# Patient Record
Sex: Male | Born: 1980 | Race: Black or African American | Hispanic: No | Marital: Single | Smoking: Current some day smoker
Health system: Southern US, Community
[De-identification: ages and names within clinical notes are randomized; demographics above are authoritative.]

## PROBLEM LIST (undated history)

## (undated) HISTORY — PX: HERNIA REPAIR: SHX51

---
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
## 2002-04-02 ENCOUNTER — Emergency Department (HOSPITAL_COMMUNITY): Admission: EM | Admit: 2002-04-02 | Discharge: 2002-04-02 | Payer: Self-pay

## 2002-05-03 ENCOUNTER — Emergency Department (HOSPITAL_COMMUNITY): Admission: EM | Admit: 2002-05-03 | Discharge: 2002-05-04 | Payer: Self-pay | Admitting: Nurse Practitioner

## 2002-06-16 ENCOUNTER — Encounter: Admission: RE | Admit: 2002-06-16 | Discharge: 2002-06-16 | Payer: Self-pay | Admitting: Family Medicine

## 2002-06-16 ENCOUNTER — Encounter: Payer: Self-pay | Admitting: Family Medicine

## 2004-07-11 ENCOUNTER — Emergency Department (HOSPITAL_COMMUNITY): Admission: EM | Admit: 2004-07-11 | Discharge: 2004-07-12 | Payer: Self-pay | Admitting: Emergency Medicine

## 2004-11-09 ENCOUNTER — Emergency Department (HOSPITAL_COMMUNITY): Admission: EM | Admit: 2004-11-09 | Discharge: 2004-11-09 | Payer: Self-pay | Admitting: Family Medicine

## 2005-06-06 ENCOUNTER — Emergency Department (HOSPITAL_COMMUNITY): Admission: EM | Admit: 2005-06-06 | Discharge: 2005-06-06 | Payer: Self-pay | Admitting: Emergency Medicine

## 2005-07-13 ENCOUNTER — Encounter (INDEPENDENT_AMBULATORY_CARE_PROVIDER_SITE_OTHER): Payer: Self-pay | Admitting: Specialist

## 2005-07-13 ENCOUNTER — Ambulatory Visit (HOSPITAL_COMMUNITY): Admission: RE | Admit: 2005-07-13 | Discharge: 2005-07-13 | Payer: Self-pay | Admitting: General Surgery

## 2006-11-02 ENCOUNTER — Emergency Department (HOSPITAL_COMMUNITY): Admission: EM | Admit: 2006-11-02 | Discharge: 2006-11-02 | Payer: Self-pay | Admitting: Emergency Medicine

## 2008-01-15 ENCOUNTER — Encounter (INDEPENDENT_AMBULATORY_CARE_PROVIDER_SITE_OTHER): Payer: Self-pay | Admitting: Emergency Medicine

## 2008-01-15 ENCOUNTER — Emergency Department (HOSPITAL_COMMUNITY): Admission: EM | Admit: 2008-01-15 | Discharge: 2008-01-15 | Payer: Self-pay | Admitting: Emergency Medicine

## 2008-01-15 ENCOUNTER — Ambulatory Visit: Payer: Self-pay | Admitting: Vascular Surgery

## 2008-03-31 IMAGING — CR DG ANKLE COMPLETE 3+V*L*
2 series · 2 of 2 positions shown · non-contrast
Comparison: none

CLINICAL DATA: Ankle injury with lateral pain and swelling. 
 LEFT ANKLE - 3 VIEW:

[view not recorded (1 of 2)]
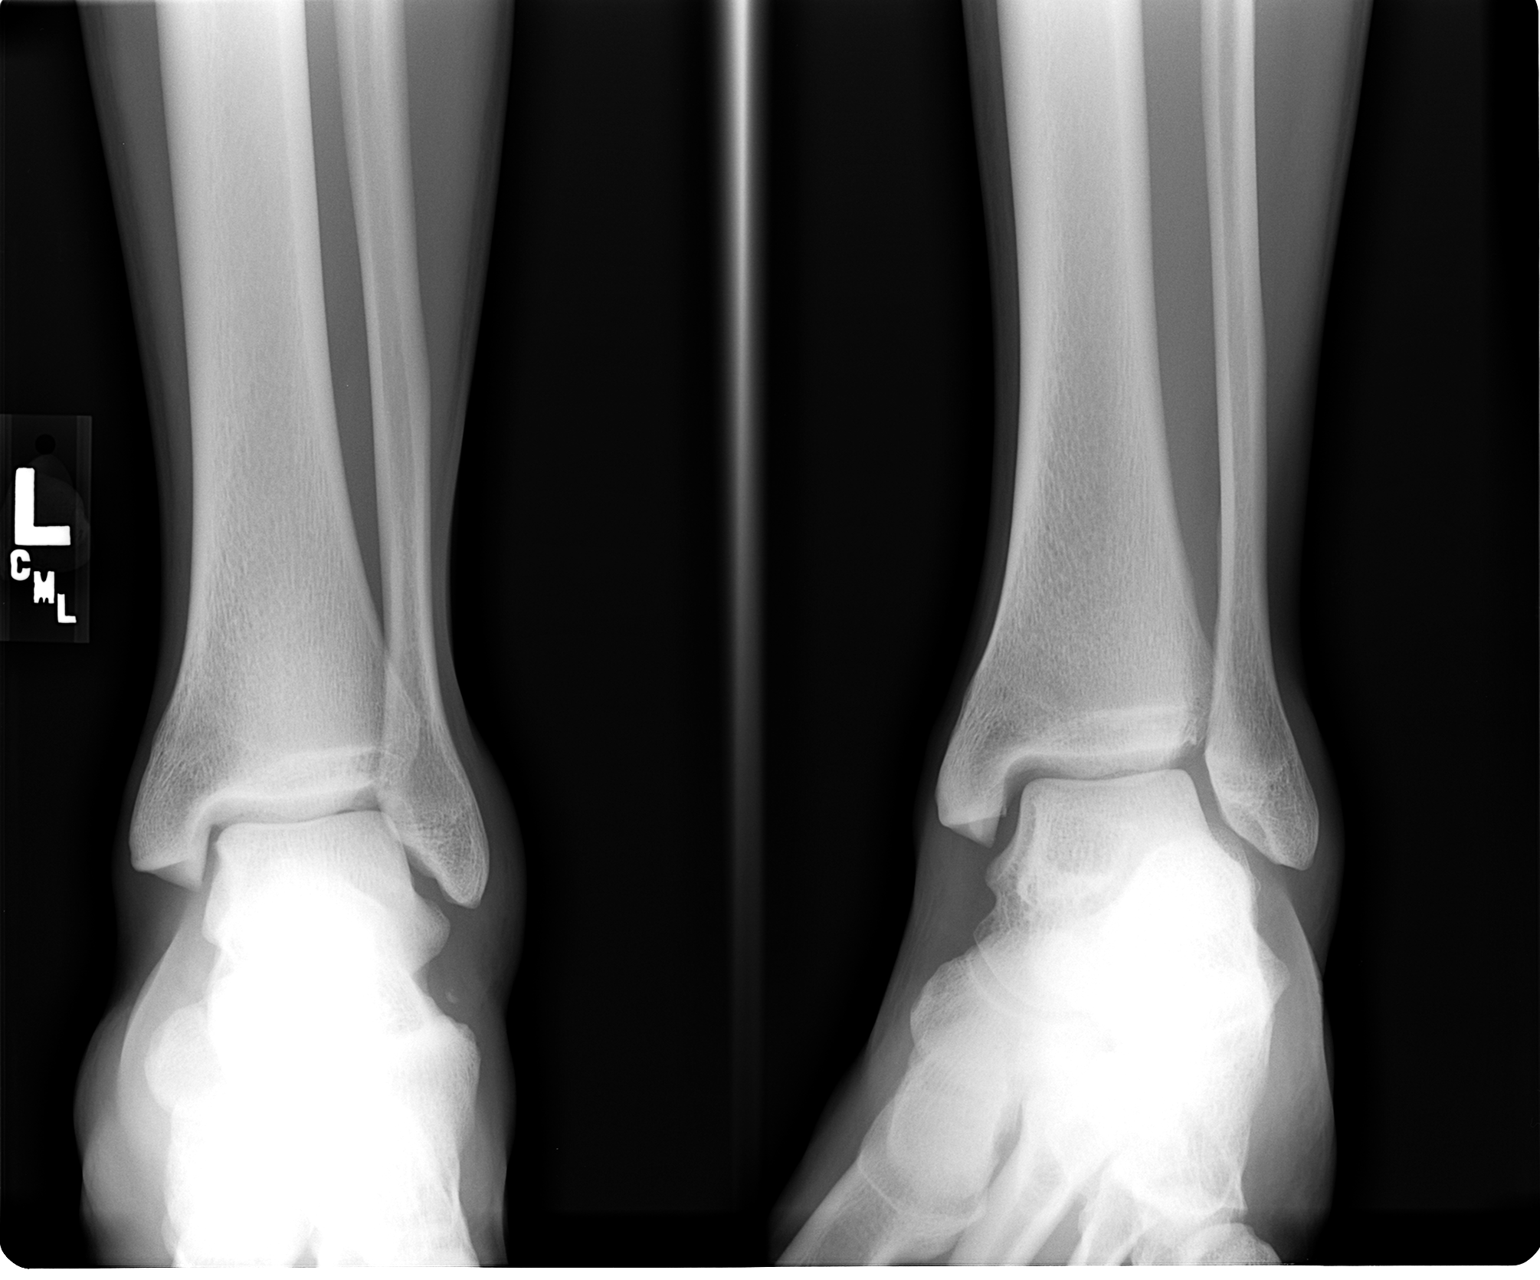

[view not recorded (2 of 2)]
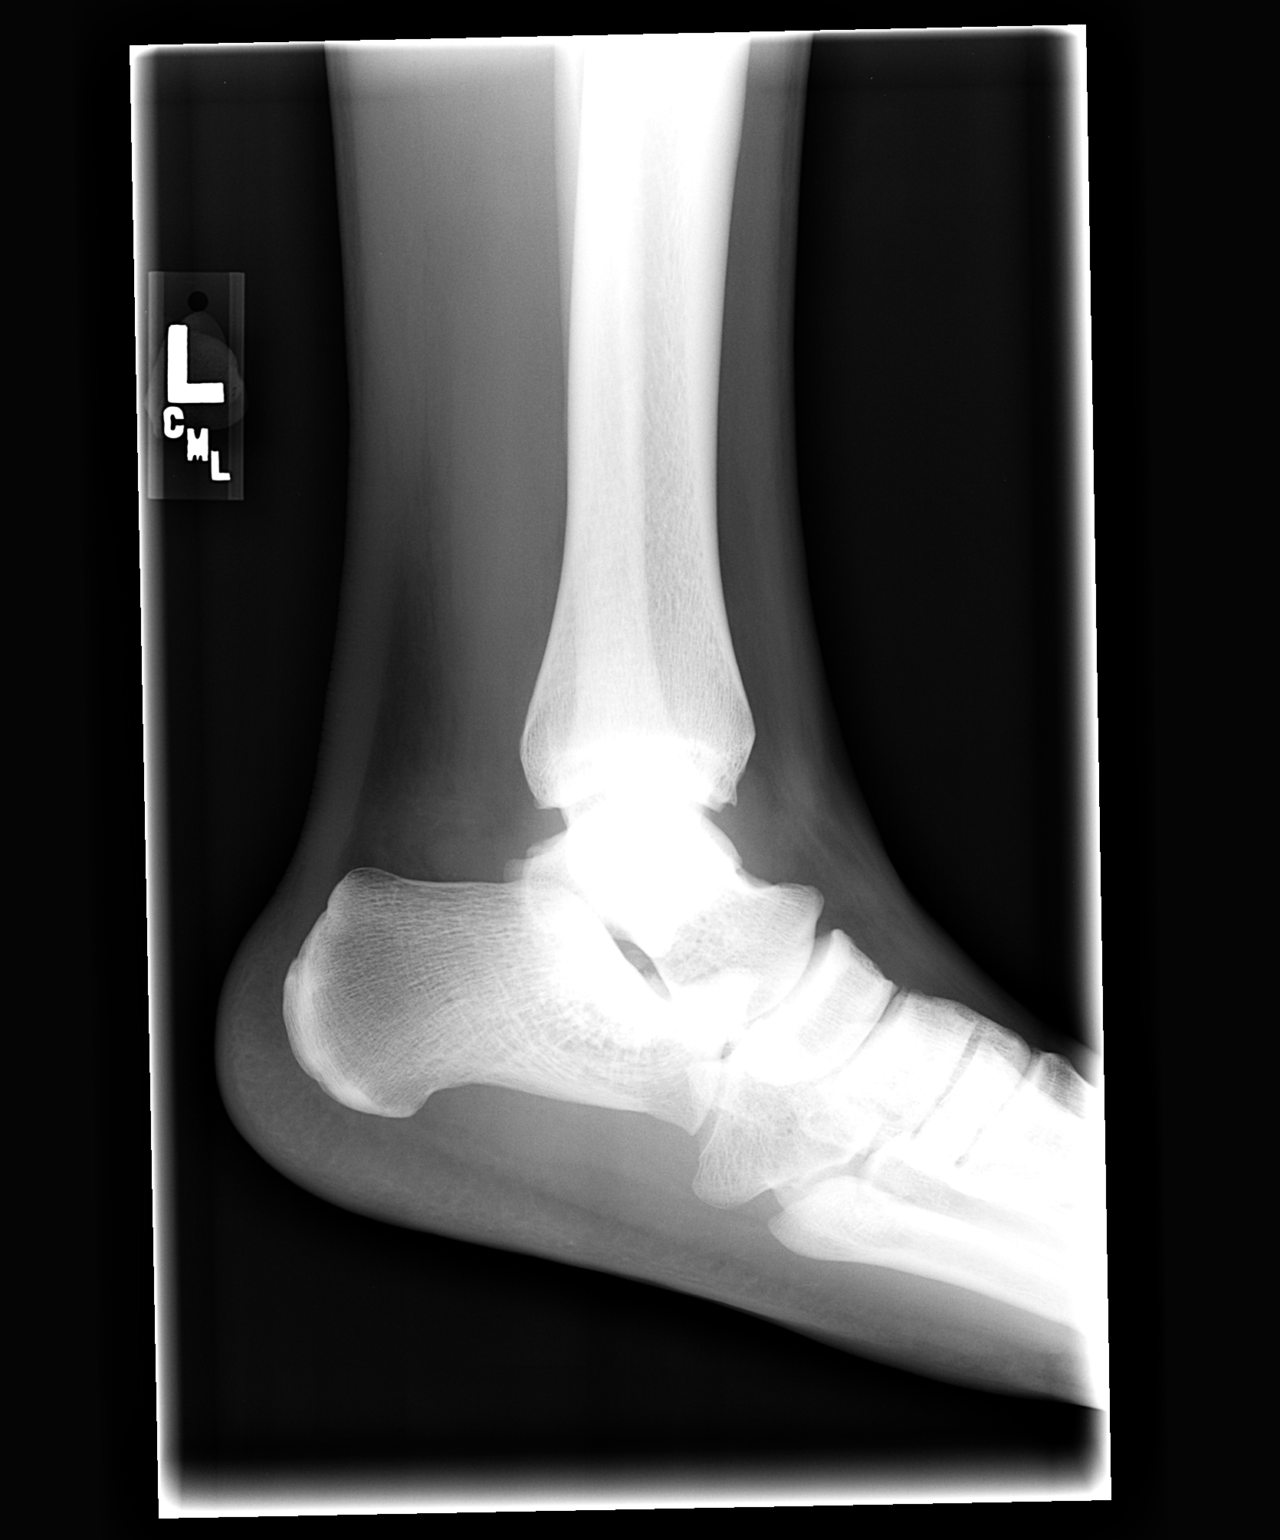

[2 of 2 positions shown; findings below may reference images not displayed]

FINDINGS: There is lateral soft tissue swelling and a joint effusion. There is a small bony density laterally that could relate to a calcaneal avulsion from a calcaneofibular ligament injury.
IMPRESSION: As discussed above.

## 2008-06-01 ENCOUNTER — Encounter: Admission: RE | Admit: 2008-06-01 | Discharge: 2008-06-01 | Payer: Self-pay | Admitting: Sports Medicine

## 2008-09-10 HISTORY — PX: KNEE SURGERY: SHX244

## 2009-04-11 ENCOUNTER — Emergency Department (HOSPITAL_COMMUNITY): Admission: EM | Admit: 2009-04-11 | Discharge: 2009-04-11 | Payer: Self-pay | Admitting: Emergency Medicine

## 2009-09-10 HISTORY — PX: KNEE SURGERY: SHX244

## 2009-10-29 IMAGING — US US ASPIRATION
1 series · 13 of 16 positions shown · non-contrast
Comparison: none

CLINICAL HISTORY: Right knee Baker's cyst.

[Series 1: us aspiration · 0.08mm/px · 29 acquisitions, 13 frames shown]
[im 1/29]
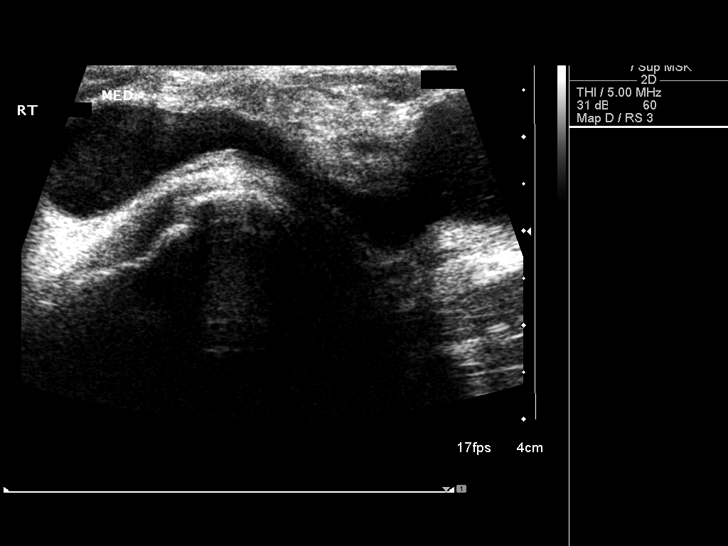
[im 2/29]
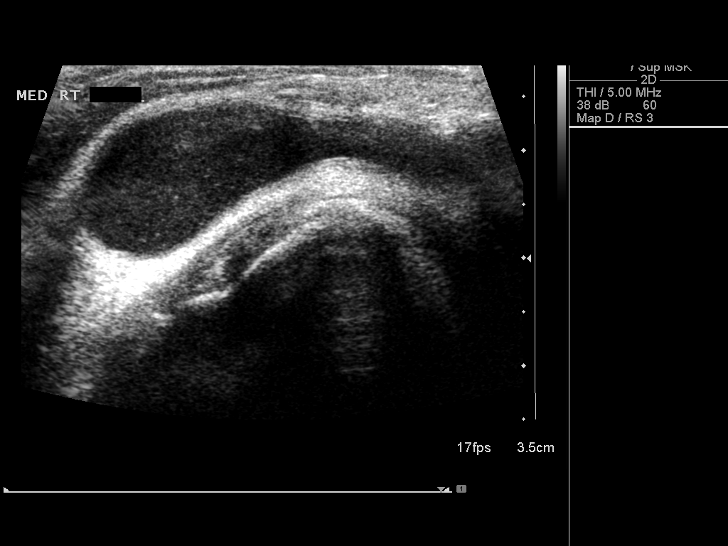
[im 6/29]
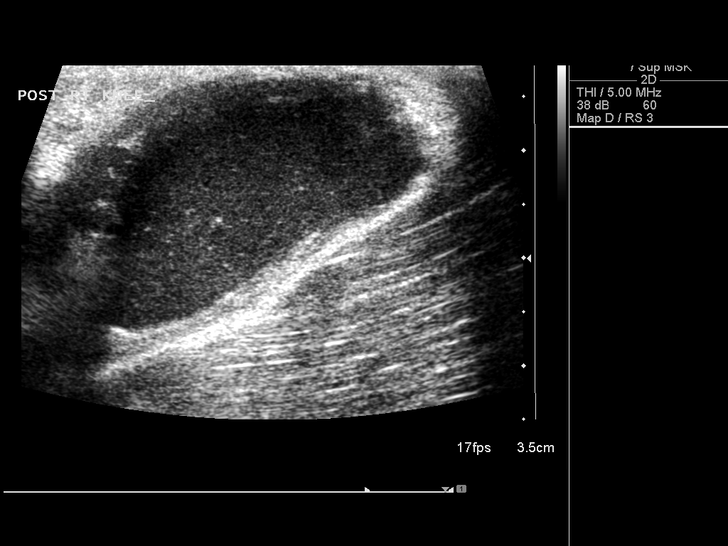
[im 8/29]
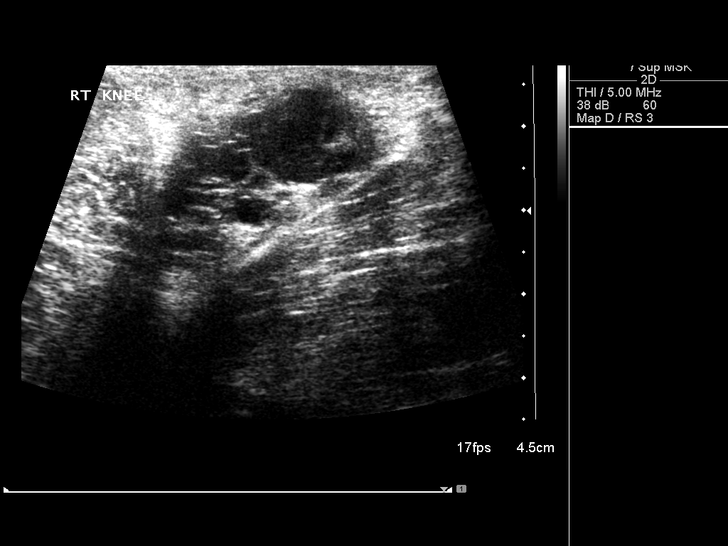
[im 10/29]
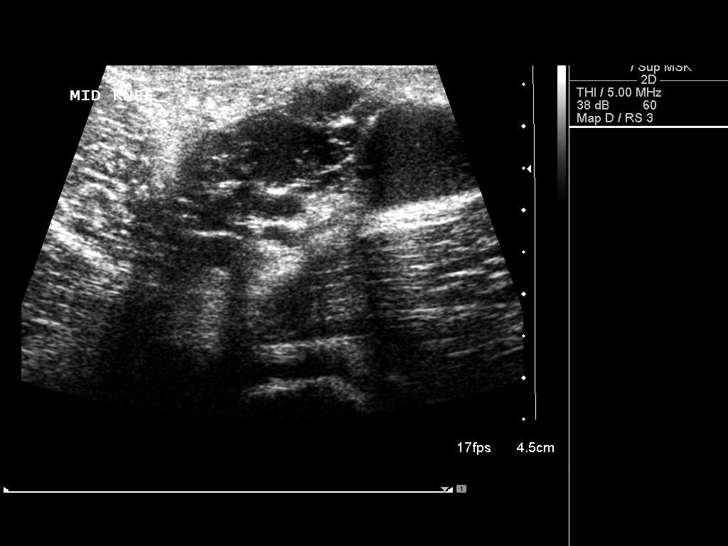
[im 12/29]
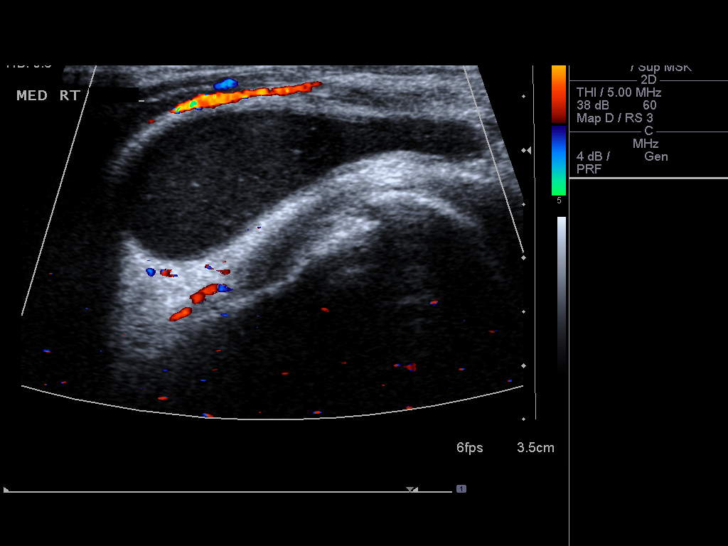
[im 15/29]
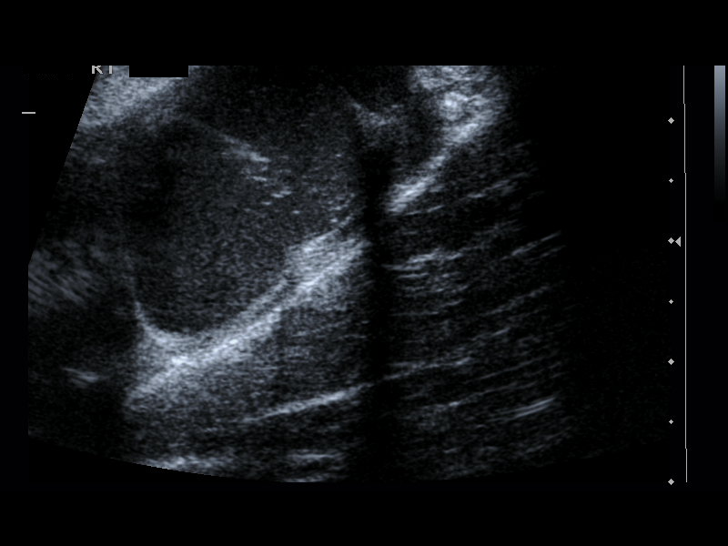
[im 17/29]
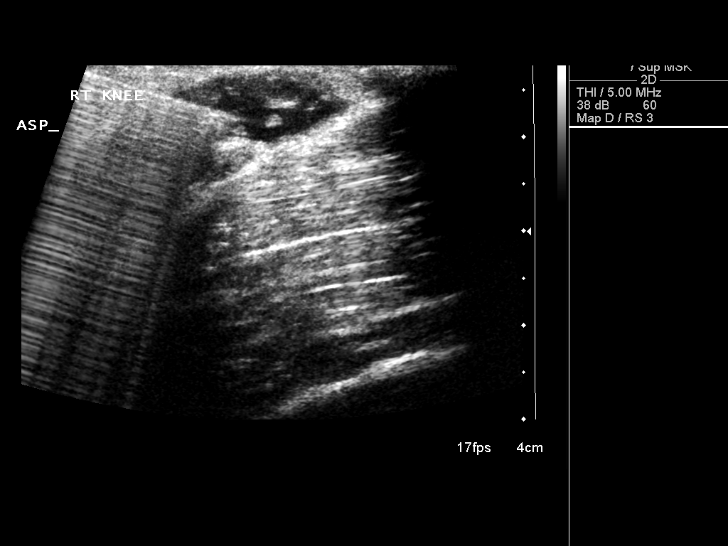
[im 19/29]
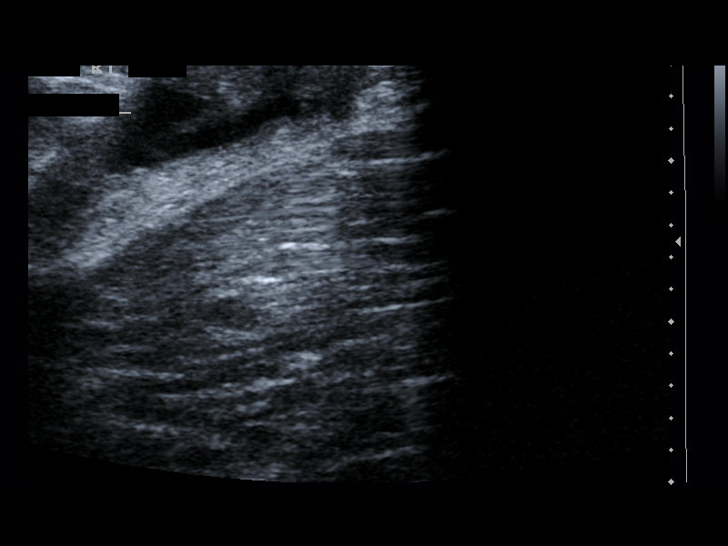
[im 21/29]
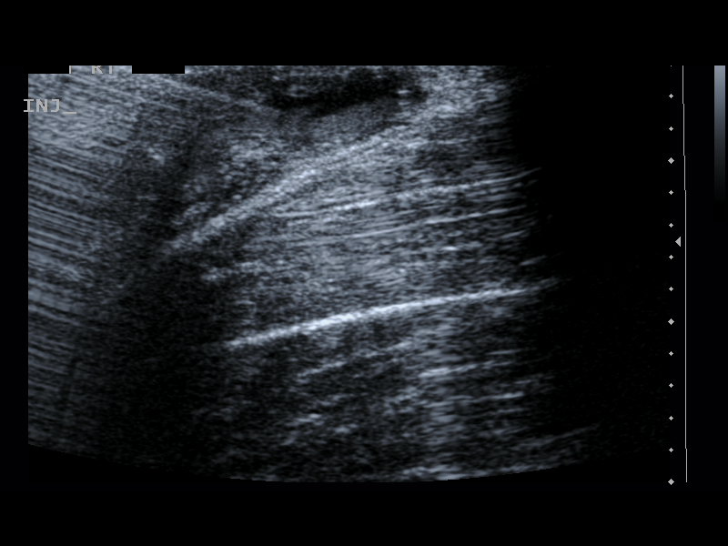
[im 23/29]
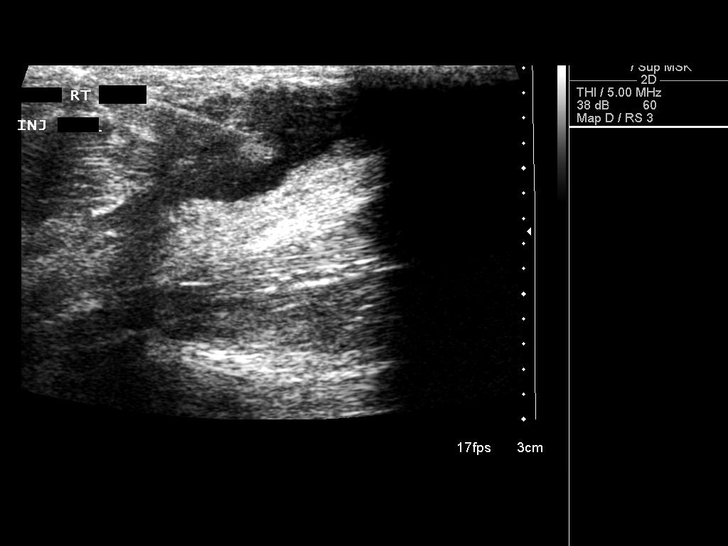
[im 27/29]
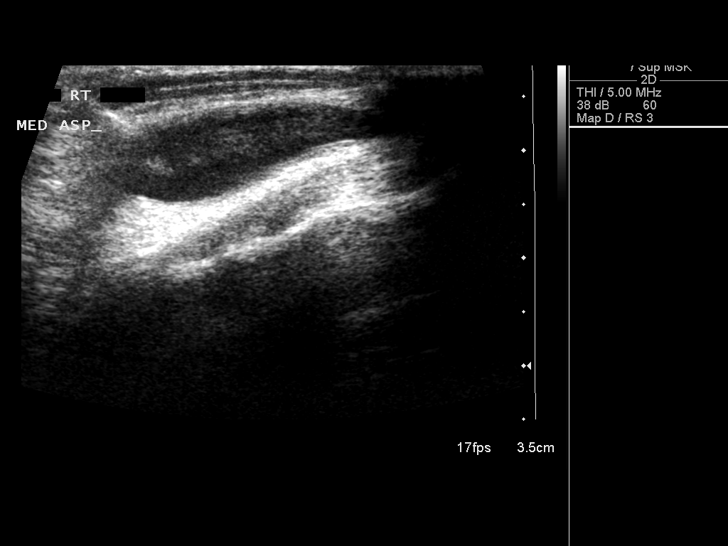
[im 29/29]
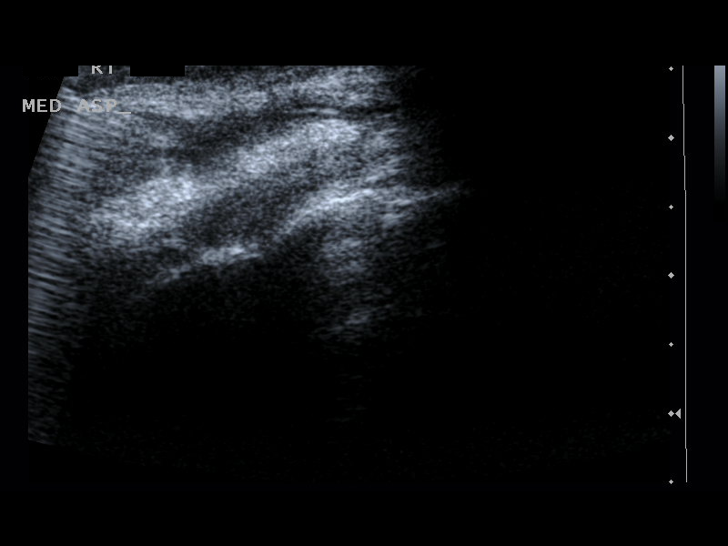

[13 of 16 positions shown; findings below may reference images not displayed]

PROCEDURE(S): ULTRASOUND GUIDED ASPIRATION AND STEROID INJECTION

Procedure:Consent was obtained for right popliteal cyst aspiration
and steroid injection.  Ultrasound demonstrated a large fluid
pocket in the right popliteal fossa.  There were additional fluid
collections extending along the medial aspect of the knee with a
complex collection along the most cephalad aspect of the popliteal
region.  Skin was prepped and draped in a sterile fashion.  The
skin was anesthetized 1% lidocaine.  18 gauge needle was directed
the largest collection and 20 ml of bloody thick gelatinous fluid
was aspirated.  2 ml and Sensorcaine and 80 mg of
methylprednisolone were mixed in a syringe.  2 ml of this mixture
was injected into the largest pocket.  Three additional small
pockets were aspirated.
FINDINGS: One large cystic pocket in the popliteal region.  There
were three additional areas which were successfully aspirated.  The
most cephalad area contained multiloculated small collections.
Majority the fluid was aspirated at the end of the procedure.  25
ml of bloody thick fluid was  aspirated.
IMPRESSION: Ultrasound guided aspiration of the popliteal cystic
lesions.  The largest cystic collection was injected with
methylprednisolone.

## 2010-03-28 ENCOUNTER — Encounter: Admission: RE | Admit: 2010-03-28 | Discharge: 2010-03-28 | Payer: Self-pay | Admitting: Orthopedic Surgery

## 2010-10-01 ENCOUNTER — Encounter: Payer: Self-pay | Admitting: Sports Medicine

## 2011-01-26 NOTE — Op Note (Signed)
NAME:  Kevin Cameron, Kevin Cameron             ACCOUNT NO.:  [id number]   MEDICAL RECORD NO.:  [id number]          PATIENT TYPE:  AMB   LOCATION:  SDS                          FACILITY:  [facility]   PHYSICIAN:  Ollen Gross. Vernell Morgans, M.D. DATE OF BIRTH:  [birth year]   DATE OF PROCEDURE:  07/13/2005  DATE OF DISCHARGE:  07/13/2005                                 OPERATIVE REPORT   PREOPERATIVE DIAGNOSIS:  Left inguinal hernia.   POSTOPERATIVE DIAGNOSIS:  Left indirect inguinal hernia.   PROCEDURE:  Left inguinal hernia repair with mesh.   SURGEON:  Dr. Carolynne Edouard.   ANESTHESIA:  General endotracheal.   PROCEDURE:  After informed consent was obtained, the patient was brought to  the operating placed in supine position on the table. After induction of  general anesthesia, the patient's left abdomen and groin were prepped with  Betadine and draped in usual sterile manner. The left groin was then  infiltrated 4% Marcaine with epinephrine. Small incision was made from the  edge of the pubic tubercle on the left towards the anterior iliac spine for  a distance of about 5 cm.  This incision was carried out through the skin  and subcutaneous tissue sharply with electrocautery. Small bridging vein was  encountered that was clamped with hemostats, divided and ligated with 3-0  silk ties. The rest of dissection was carried through the subcutaneous  tissue sharply with electrocautery until the fascia of the external oblique  was encountered. The fascia of the external oblique was opened along its  fibers using a 15 blade knife and Metzenbaum scissors.  The ilioinguinal  nerve was then identified.  It appeared to be involved with some scar  tissue, so the ilioinguinal nerve was subsequently clamped both proximally,  distally with hemostats, divided and ligated with 3-0 silk ties. A Weitlaner  retractor was deployed.  Blunt dissection was carried out over the cord  structures at the edge of the pubic tubercle using blunt  finger dissection  until the cord structures could be surrounded between two fingers. A 1/2-  inch Penrose drain was then placed around the cord structures for retraction  purposes. The cord structures were then gently skeletonized by combination  of blunt hemostat dissection and sharp dissection with electrocautery. A  hernia sac was identified. This was gently separated from the rest of cord  structures. The sac was then opened sharply with Metzenbaum scissors. There  were no visceral contents within the sac.  The sac was ligated near its base  with a 2-0 silk suture ligature and the distal sac was excised and sent to  pathology for identification. The stump of the sac was then allowed to  retract back beneath the transversalis muscular layer.  Next a piece of  Parietex polyester mesh was chosen and cut to fit.  The mesh was sewed  inferiorly to the shelving edge of inguinal ligament using a running 2-0  Prolene stitch. Tails were cut in the mesh laterally and the tails were  wrapped around the cord structures and the tails were anchored laterally  with an interrupted 2-0 Prolene stitch. The  mesh was sewed superiorly to the  muscular aponeurotic strength layer of the transversalis with interrupted 2-  0 Prolene vertical mattress stitches. Once this was accomplished, the mesh  was in good position without any tension.  The wound was then irrigated with  copious amounts of saline and the fascia of the external oblique was then  reapproximated with a running 2-0 Vicryl stitch. The subcutaneous fascia was  reapproximated with a running 3-0 Vicryl stitch and skin  was closed with a running 4-0  Monocryl subcuticular stitch. Benzoin, Steri-  Strips and sterile dressings were applied. The patient tolerated well.  At  the end of the case all needle, sponge and instrument counts correct. The  patient was then awakened and taken to recovery room in stable condition.      Ollen Gross. Vernell Morgans,  M.D.  Electronically Signed     PST/MEDQ  D:  07/17/2005  T:  07/18/2005  Job:  [id number]

## 2011-10-23 ENCOUNTER — Encounter (HOSPITAL_COMMUNITY): Payer: Self-pay | Admitting: *Deleted

## 2011-10-23 ENCOUNTER — Emergency Department (HOSPITAL_COMMUNITY)
Admission: EM | Admit: 2011-10-23 | Discharge: 2011-10-24 | Disposition: A | Discharge status: Home / Self Care | Payer: Self-pay | Attending: Emergency Medicine | Admitting: Emergency Medicine

## 2011-10-23 DIAGNOSIS — IMO0002 Reserved for concepts with insufficient information to code with codable children: Secondary | ICD-10-CM | POA: Insufficient documentation

## 2011-10-23 DIAGNOSIS — S0180XA Unspecified open wound of other part of head, initial encounter: Secondary | ICD-10-CM | POA: Insufficient documentation

## 2011-10-23 MED ORDER — LIDOCAINE HCL 2 % IJ SOLN
10.0000 mL | Freq: Once | INTRAMUSCULAR | Status: AC
  Administered 2011-10-23: 20 mg via INTRADERMAL
  Filled 2011-10-23: qty 1

## 2011-10-23 NOTE — ED Notes (Signed)
Patient was practicing and ran onto someone and "bump" heads and now he has a laceration on top of his right eye lid.  No active bleeding at this time.  Patient denies LOC.

## 2011-10-24 MED ORDER — TETANUS-DIPHTH-ACELL PERTUSSIS 5-2.5-18.5 LF-MCG/0.5 IM SUSP
INTRAMUSCULAR | Status: AC
  Administered 2011-10-24: 0.5 mL via INTRAMUSCULAR
  Filled 2011-10-24: qty 0.5

## 2011-10-24 MED ORDER — IBUPROFEN 600 MG PO TABS: 600.0000 mg | ORAL_TABLET | Freq: Four times a day (QID) | ORAL | Status: AC | PRN

## 2011-10-24 MED ORDER — TETANUS-DIPHTHERIA TOXOIDS TD 5-2 LFU IM INJ: 0.5000 mL | INJECTION | Freq: Once | INTRAMUSCULAR | Status: DC

## 2011-10-24 MED ORDER — OXYCODONE-ACETAMINOPHEN 5-325 MG PO TABS: 2.0000 | ORAL_TABLET | ORAL | Status: AC | PRN

## 2011-10-24 NOTE — Discharge Instructions (Signed)
[name] you can return here in 3-5 days for suture sutures removed. He can also go to your primary care provider or the urgent care. Apply ointment once a day. Like 24 hours to get the area wet. If she did play sports cover the laceration. He received a tetanus shot tonight in the ER. This good for 10 years.   Laceration Care, Adult A laceration is a cut or lesion that goes through all layers of the skin and into the tissue just beneath the skin. TREATMENT  Some lacerations may not require closure. Some lacerations may not be able to be closed due to an increased risk of infection. It is important to see your caregiver as soon as possible after an injury to minimize the risk of infection and maximize the opportunity for successful closure. If closure is appropriate, pain medicines may be given, if needed. The wound will be cleaned to help prevent infection. Your caregiver will use stitches (sutures), staples, wound glue (adhesive), or skin adhesive strips to repair the laceration. These tools bring the skin edges together to allow for faster healing and a better cosmetic outcome. However, all wounds will heal with a scar. Once the wound has healed, scarring can be minimized by covering the wound with sunscreen during the day for 1 full year. HOME CARE INSTRUCTIONS  For sutures or staples:  Keep the wound clean and dry.   If you were given a bandage (dressing), you should change it at least once a day. Also, change the dressing if it becomes wet or dirty, or as directed by your caregiver.   Wash the wound with soap and water 2 times a day. Rinse the wound off with water to remove all soap. Pat the wound dry with a clean towel.   After cleaning, apply a thin layer of the antibiotic ointment as recommended by your caregiver. This will help prevent infection and keep the dressing from sticking.   You may shower as usual after the first 24 hours. Do not soak the wound in water until the sutures are  removed.   Only take over-the-counter or prescription medicines for pain, discomfort, or fever as directed by your caregiver.   Get your sutures or staples removed as directed by your caregiver.  For skin adhesive strips:  Keep the wound clean and dry.   Do not get the skin adhesive strips wet. You may bathe carefully, using caution to keep the wound dry.   If the wound gets wet, pat it dry with a clean towel.   Skin adhesive strips will fall off on their own. You may trim the strips as the wound heals. Do not remove skin adhesive strips that are still stuck to the wound. They will fall off in time.  For wound adhesive:  You may briefly wet your wound in the shower or bath. Do not soak or scrub the wound. Do not swim. Avoid periods of heavy perspiration until the skin adhesive has fallen off on its own. After showering or bathing, gently pat the wound dry with a clean towel.   Do not apply liquid medicine, cream medicine, or ointment medicine to your wound while the skin adhesive is in place. This may loosen the film before your wound is healed.   If a dressing is placed over the wound, be careful not to apply tape directly over the skin adhesive. This may cause the adhesive to be pulled off before the wound is healed.   Avoid prolonged  exposure to sunlight or tanning lamps while the skin adhesive is in place. Exposure to ultraviolet light in the first year will darken the scar.   The skin adhesive will usually remain in place for 5 to 10 days, then naturally fall off the skin. Do not pick at the adhesive film.  You may need a tetanus shot if:  You cannot remember when you had your last tetanus shot.   You have never had a tetanus shot.  If you get a tetanus shot, your arm may swell, get red, and feel warm to the touch. This is common and not a problem. If you need a tetanus shot and you choose not to have one, there is a rare chance of getting tetanus. Sickness from tetanus can be  serious. SEEK MEDICAL CARE IF:   You have redness, swelling, or increasing pain in the wound.   You see a red line that goes away from the wound.   You have yellowish-white fluid (pus) coming from the wound.   You have a fever.   You notice a bad smell coming from the wound or dressing.   Your wound breaks open before or after sutures have been removed.   You notice something coming out of the wound such as wood or glass.   Your wound is on your hand or foot and you cannot move a finger or toe.  SEEK IMMEDIATE MEDICAL CARE IF:   Your pain is not controlled with prescribed medicine.   You have severe swelling around the wound causing pain and numbness or a change in color in your arm, hand, leg, or foot.   Your wound splits open and starts bleeding.   You have worsening numbness, weakness, or loss of function of any joint around or beyond the wound.   You develop painful lumps near the wound or on the skin anywhere on your body.  MAKE SURE YOU:   Understand these instructions.   Will watch your condition.   Will get help right away if you are not doing well or get worse.  Document Released: 08/27/2005 Document Revised: 05/09/2011 Document Reviewed: 02/20/2011 [facility] Patient Information 2012 [place], [place].

## 2011-10-26 NOTE — ED Provider Notes (Signed)
History     CSN: [id number]  Arrival date & time 10/23/11  2256   First MD Initiated Contact with Patient 10/23/11 2324      Chief Complaint  Patient presents with  . Laceration    (Consider location/radiation/quality/duration/timing/severity/associated sxs/prior treatment) Patient is a 31 y.o. male presenting with skin laceration. The history is provided by the patient.  Laceration  The incident occurred 1 to 2 hours ago. Pain location: R eyebrow. The laceration is 4 cm in size. The laceration mechanism was a a blunt object. The pain is at a severity of 4/10. The pain is mild. The pain has been intermittent since onset. He reports no foreign bodies present. His tetanus status is out of date.    History reviewed. No pertinent past medical history.  History reviewed. No pertinent past surgical history.  History reviewed. No pertinent family history.  History  Substance Use Topics  . Smoking status: Passive Smoker    Types: Cigarettes  . Smokeless tobacco: Not on file  . Alcohol Use: Yes      Review of Systems  All other systems reviewed and are negative.    Allergies  Review of patient's allergies indicates no known allergies.  Home Medications   Current Outpatient Rx  Name Route Sig Dispense Refill  . IBUPROFEN 600 MG PO TABS Oral Take 1 tablet (600 mg total) by mouth every 6 (six) hours as needed for pain. 30 tablet 0  . OXYCODONE-ACETAMINOPHEN 5-325 MG PO TABS Oral Take 2 tablets by mouth every 4 (four) hours as needed for pain. 6 tablet 0    BP 126/63  Pulse 90  Temp(Src) 99 F (37.2 C) (Oral)  Resp 16  SpO2 99%  Physical Exam  Nursing note and vitals reviewed. Constitutional: He is oriented to person, place, and time. He appears well-developed and well-nourished.  HENT:  Head: Normocephalic.  Eyes: Pupils are equal, round, and reactive to light.  Neck: Normal range of motion. Neck supple.  Cardiovascular: Normal rate.  Exam reveals no gallop and  no friction rub.   No murmur heard. Pulmonary/Chest: Breath sounds normal. No respiratory distress.  Abdominal: Soft. He exhibits no distension.  Musculoskeletal: Normal range of motion.       Lac to R eyebrow bleeding controlled  Neurological: He is alert and oriented to person, place, and time. No cranial nerve deficit.  Skin: Skin is warm and dry.  Psychiatric: He has a normal mood and affect.    ED Course  LACERATION REPAIR Date/Time: 10/23/2011 11:05 PM Performed by: Jethro Bastos Authorized by: Jethro Bastos Consent: Verbal consent obtained. Written consent not obtained. Risks and benefits: risks, benefits and alternatives were discussed Consent given by: patient Patient understanding: patient states understanding of the procedure being performed Relevant documents: relevant documents present and verified Site marked: the operative site was marked Patient identity confirmed: verbally with patient, arm band, provided demographic data and hospital-assigned identification number Body area: head/neck Laceration length: 4 cm Local anesthetic: lidocaine 2% without epinephrine Anesthetic total: 6 ml Patient sedated: no Preparation: Patient was prepped and draped in the usual sterile fashion. Irrigation solution: saline Irrigation method: syringe Amount of cleaning: standard Debridement: none Degree of undermining: none Skin closure: 3-0 Prolene Number of sutures: 4 Technique: simple Approximation: close Approximation difficulty: simple Dressing: antibiotic ointment Patient tolerance: Patient tolerated the procedure well with no immediate complications.   (including critical care time)  Labs Reviewed - No data to display No results found.  1. Laceration       MDM  Laceration repair to R eyebrow sustained while playing basketball. Tolerated well.  Will return here or pcp in 3-5 days for suture removal.         Jethro Bastos, NP 10/26/11 [phone]

## 2011-10-27 NOTE — ED Provider Notes (Signed)
Medical screening examination/treatment/procedure(s) were performed by non-physician practitioner and as supervising physician I was immediately available for consultation/collaboration.   Onesty Clair, MD 10/27/11 0127 

## 2011-10-29 ENCOUNTER — Encounter (HOSPITAL_COMMUNITY): Payer: Self-pay | Admitting: Emergency Medicine

## 2011-10-29 ENCOUNTER — Emergency Department (HOSPITAL_COMMUNITY)
Admission: EM | Admit: 2011-10-29 | Discharge: 2011-10-29 | Disposition: A | Discharge status: Home / Self Care | Payer: Self-pay | Attending: Emergency Medicine | Admitting: Emergency Medicine

## 2011-10-29 DIAGNOSIS — Z4802 Encounter for removal of sutures: Secondary | ICD-10-CM | POA: Insufficient documentation

## 2011-10-29 NOTE — ED Notes (Signed)
Pt alert, nad, c/o suture removal left eye, stiches placed last week, area healed, no s/s infection noted

## 2011-10-29 NOTE — Discharge Instructions (Signed)
Follow provided instructions. Return with any concerns.

## 2011-10-29 NOTE — ED Provider Notes (Signed)
History     CSN: [id number]  Arrival date & time 10/29/11  2253   First MD Initiated Contact with Patient 10/29/11 2316      Chief Complaint  Patient presents with  . Suture / Staple Removal    (Consider location/radiation/quality/duration/timing/severity/associated sxs/prior treatment) HPI Comments: Patient here for suture removal. Patient received 4 sutures above right eye 6 days ago. Patient reports no pain or discharge. He's had some minor itching.  Patient is a 31 y.o. male presenting with suture removal. The history is provided by the patient.  Suture / Staple Removal  The sutures were placed 3 to 6 days ago. There has been no treatment since the wound repair. There has been no drainage from the wound. There is no redness present. There is no swelling present. The pain has no pain.    History reviewed. No pertinent past medical history.  History reviewed. No pertinent past surgical history.  No family history on file.  History  Substance Use Topics  . Smoking status: Passive Smoker    Types: Cigarettes  . Smokeless tobacco: Not on file  . Alcohol Use: Yes      Review of Systems  Constitutional: Negative for fatigue.  HENT: Negative for facial swelling.   Skin: Positive for wound.    Allergies  Review of patient's allergies indicates no known allergies.  Home Medications   Current Outpatient Rx  Name Route Sig Dispense Refill  . OXYCODONE-ACETAMINOPHEN 5-325 MG PO TABS Oral Take 2 tablets by mouth every 4 (four) hours as needed for pain. 6 tablet 0  . IBUPROFEN 600 MG PO TABS Oral Take 1 tablet (600 mg total) by mouth every 6 (six) hours as needed for pain. 30 tablet 0    BP 149/97  Pulse 60  Temp(Src) 98.3 F (36.8 C) (Oral)  Resp 18  Wt 180 lb (81.647 kg)  SpO2 98%  Physical Exam  Nursing note and vitals reviewed. Constitutional: He is oriented to person, place, and time. He appears well-developed and well-nourished.  HENT:  Head:  Normocephalic.       Well healed, linear, 3 cm laceration above left eye. 4 Prolene stitches intact. No drainage or surrounding cellulitis.  Eyes: Conjunctivae are normal.  Neck: Normal range of motion. Neck supple.  Pulmonary/Chest: No respiratory distress.  Neurological: He is alert and oriented to person, place, and time.  Skin: Skin is warm and dry.  Psychiatric: He has a normal mood and affect.    ED Course  Procedures (including critical care time)  Labs Reviewed - No data to display No results found.   1. Visit for suture removal    11:46 PM Patient seen and examined.   Vital signs reviewed and are as follows: Filed Vitals:   10/29/11 2257  BP: 149/97  Pulse: 60  Temp: 98.3 F (36.8 C)  Resp: 18   SUTURE REMOVAL Performed by: Carolee Rota  Consent: Verbal consent obtained. Consent given by: patient Required items: required blood products, implants, devices, and special equipment available Time out: Immediately prior to procedure a "time out" was called to verify the correct patient, procedure, equipment, support staff and site/side marked as required.  Location: right eyebrow  Wound Appearance: clean  Sutures/Staples Removed: 4 sutures  Patient tolerance: Patient tolerated the procedure well with no immediate complications.  Urged to return with worsening other concerns. MDM  Well-healed laceration, sutures removed without incident.        Eustace Moore Colma, [place] 10/29/11 2348  Medical screening examination/treatment/procedure(s) were performed by non-physician practitioner and as supervising physician I was immediately available for consultation/collaboration.  Sunnie Nielsen, MD 10/30/11 [phone]

## 2011-10-29 NOTE — ED Notes (Signed)
MD at bedside. 

## 2014-01-22 ENCOUNTER — Encounter (INDEPENDENT_AMBULATORY_CARE_PROVIDER_SITE_OTHER): Payer: Self-pay | Admitting: General Surgery

## 2014-01-22 ENCOUNTER — Other Ambulatory Visit: Payer: Self-pay | Admitting: Family

## 2014-01-22 DIAGNOSIS — R10811 Right upper quadrant abdominal tenderness: Secondary | ICD-10-CM

## 2014-01-27 ENCOUNTER — Ambulatory Visit
Admission: RE | Admit: 2014-01-27 | Discharge: 2014-01-27 | Disposition: A | Discharge status: Home / Self Care | Payer: BC Managed Care – PPO | Source: Ambulatory Visit | Attending: Family | Admitting: Family

## 2014-01-27 DIAGNOSIS — R10811 Right upper quadrant abdominal tenderness: Secondary | ICD-10-CM

## 2014-01-27 MED ORDER — IOHEXOL 300 MG/ML  SOLN
75.0000 mL | Freq: Once | INTRAMUSCULAR | Status: AC | PRN
  Administered 2014-01-27: 75 mL via INTRAVENOUS

## 2014-02-04 ENCOUNTER — Ambulatory Visit (INDEPENDENT_AMBULATORY_CARE_PROVIDER_SITE_OTHER): Payer: BC Managed Care – PPO | Admitting: General Surgery

## 2014-02-04 ENCOUNTER — Encounter (INDEPENDENT_AMBULATORY_CARE_PROVIDER_SITE_OTHER): Payer: Self-pay | Admitting: General Surgery

## 2014-02-04 VITALS — BP 134/80 | HR 57 | Temp 98.5°F | Resp 16 | Ht 72.0 in | Wt 178.0 lb

## 2014-02-04 DIAGNOSIS — R229 Localized swelling, mass and lump, unspecified: Secondary | ICD-10-CM | POA: Insufficient documentation

## 2014-02-04 NOTE — Patient Instructions (Signed)
Our office will contact you to schedule surgery for August. If you have not heard from our office by the second week of June, please contact our office  Lipoma A lipoma is a noncancerous (benign) tumor composed of fat cells. They are usually found under the skin (subcutaneous). A lipoma may occur in any tissue of the body that contains fat. Common areas for lipomas to appear include the back, shoulders, buttocks, and thighs. Lipomas are a very common soft tissue growth. They are soft and grow slowly. Most problems caused by a lipoma depend on where it is growing. DIAGNOSIS  A lipoma can be diagnosed with a physical exam. These tumors rarely become cancerous, but radiographic studies can help determine this for certain. Studies used may include:  Computerized X-ray scans (CT or CAT scan).  Computerized magnetic scans (MRI). TREATMENT  Small lipomas that are not causing problems may be watched. If a lipoma continues to enlarge or causes problems, removal is often the best treatment. Lipomas can also be removed to improve appearance. Surgery is done to remove the fatty cells and the surrounding capsule. Most often, this is done with medicine that numbs the area (local anesthetic). The removed tissue is examined under a microscope to make sure it is not cancerous. Keep all follow-up appointments with your caregiver. SEEK MEDICAL CARE IF:   The lipoma becomes larger or hard.  The lipoma becomes painful, red, or increasingly swollen. These could be signs of infection or a more serious condition. Document Released: 08/17/2002 Document Revised: 11/19/2011 Document Reviewed: 01/27/2010 [facility] Patient Information 2014 [place], [place].

## 2014-02-04 NOTE — Progress Notes (Signed)
Patient ID: Kevin Cameron, male   DOB: [birth year], 33 y.o.   MRN: [id number]  Chief Complaint  Patient presents with  . eval rlq pain    HPI Kevin Cameron is a 33 y.o. male.   HPI 33 year old African American male referred by DR Boneta Lucks for evaluation of a right lateral lower abdominal wall mass. The patient states the area has been there since 2012. It has not changed in size. It does not fluctuate in size. It doesn't really cause a lot of pain or discomfort. There is no burning or stinging sensation. He noticed it after a period of lifting lots of heavy weights. He denies any diarrhea or constipation. He denies any difficulty urinating. He denies any other lumps or bumps. He has lost some weight since he hasn't been lifting heavy weights recently. He denies any family history of cancer. He has had a prior left groin hernia surgery. He has been having problems with headaches.he takes medicine for reflux History reviewed. No pertinent past medical history.  Past Surgical History  Procedure Laterality Date  . Hernia repair      left  . Knee surgery Right 2011  . Knee surgery Left 2010    History reviewed. No pertinent family history.  Social History History  Substance Use Topics  . Smoking status: Passive Smoke Exposure - Never Smoker    Types: Cigarettes  . Smokeless tobacco: Not on file  . Alcohol Use: Yes    Allergies  Allergen Reactions  . Skelaxin [Metaxalone] Nausea And Vomiting    Current Outpatient Prescriptions  Medication Sig Dispense Refill  . pantoprazole (PROTONIX) 40 MG tablet Take 40 mg by mouth daily.       No current facility-administered medications for this visit.    Review of Systems Review of Systems  Constitutional: Negative for fever, chills, appetite change and unexpected weight change.  HENT: Negative for congestion and trouble swallowing.   Eyes: Negative for visual disturbance.  Respiratory: Negative for chest tightness and  shortness of breath.   Cardiovascular: Negative for chest pain and leg swelling.       No PND, no orthopnea, no DOE  Gastrointestinal: Positive for abdominal pain.       See HPI  Genitourinary: Negative for dysuria and hematuria.  Musculoskeletal: Negative.   Skin: Negative for rash.  Neurological: Positive for headaches. Negative for seizures and speech difficulty.  Hematological: Does not bruise/bleed easily.  Psychiatric/Behavioral: Negative for behavioral problems and confusion.    Blood pressure 134/80, pulse 57, temperature 98.5 F (36.9 C), resp. rate 16, height 6' (1.829 m), weight 178 lb (80.74 kg).  Physical Exam Physical Exam  Constitutional: He is oriented to person, place, and time. He appears well-developed and well-nourished. No distress.  HENT:  Head: Normocephalic and atraumatic.  Right Ear: External ear normal.  Left Ear: External ear normal.  Eyes: Conjunctivae are normal. No scleral icterus.  Neck: Normal range of motion. Neck supple. No tracheal deviation present. No thyromegaly present.  Cardiovascular: Normal rate, normal heart sounds and intact distal pulses.   Pulmonary/Chest: Effort normal and breath sounds normal. No respiratory distress. He has no wheezes.  Abdominal: Soft. He exhibits no distension. There is no tenderness. There is no rebound and no guarding. Hernia confirmed negative in the right inguinal area and confirmed negative in the left inguinal area.    Right lower quadrant lateral abdominal wall subcutaneous mass. Soft, well circumscribed, mobile, nontender, no overlying skin lesions, linear oblique mass,  about 3 cm long by 1 cm wide. rubbery  Musculoskeletal: Normal range of motion. He exhibits no edema and no tenderness.  Lymphadenopathy:    He has no cervical adenopathy.       Right: No inguinal adenopathy present.       Left: No inguinal adenopathy present.  Neurological: He is alert and oriented to person, place, and time. He exhibits  normal muscle tone.  Skin: Skin is warm and dry. No rash noted. He is not diaphoretic. No erythema. No pallor.  Psychiatric: He has a normal mood and affect. His behavior is normal. Judgment and thought content normal.    Data Reviewed Boneta LucksJennifer Brown office note 5/14 nml cbc, cmet 01/21/14 abd u/s results and ct head results  Assessment    Right lower abdominal wall subcutaneous mass     Plan    To me this is not really consistent with a hernia. It is in the wrong location to be an inguinal hernia. It is really not consistent with a spigelian due to its orientation and shape. 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  We discussed the etiology and management of lipomas. The patient was given educational material. We discussed that the majority of lipomas are benign although on a rare occasion it can be malignant.   We discussed observation versus surgical excision. We discussed the risks and benefits of surgery including but not limited to bleeding, infection, injury to surrounding structures, scarring, cosmetic concerns, blood clot formation, anesthesia issues, possible recurrence, and the typical postoperative course.   The patient has elected to have the area excised but will wait until August because of work related projects.    Our office will contact him to schedule surgery once our August operating room calendar is open  Mary SellaEric M. Andrey CampanileWilson, MD, FACS General, Bariatric, & Minimally Invasive Surgery [facility]         Atilano Inaric M Anastasios Melander 02/04/2014, 5:42 PM

## 2014-05-24 ENCOUNTER — Ambulatory Visit: Payer: BC Managed Care – PPO | Attending: Family | Admitting: Physical Therapy

## 2016-12-04 ENCOUNTER — Encounter (HOSPITAL_COMMUNITY): Payer: Self-pay | Admitting: Emergency Medicine

## 2016-12-04 ENCOUNTER — Ambulatory Visit (HOSPITAL_COMMUNITY)
Admission: EM | Admit: 2016-12-04 | Discharge: 2016-12-04 | Disposition: A | Discharge status: Home / Self Care | Payer: Self-pay | Attending: Internal Medicine | Admitting: Internal Medicine

## 2016-12-04 DIAGNOSIS — Z202 Contact with and (suspected) exposure to infections with a predominantly sexual mode of transmission: Secondary | ICD-10-CM | POA: Insufficient documentation

## 2016-12-04 DIAGNOSIS — F17210 Nicotine dependence, cigarettes, uncomplicated: Secondary | ICD-10-CM | POA: Insufficient documentation

## 2016-12-04 MED ORDER — CEFTRIAXONE SODIUM 250 MG IJ SOLR
INTRAMUSCULAR | Status: AC
  Filled 2016-12-04: qty 250

## 2016-12-04 MED ORDER — AZITHROMYCIN 250 MG PO TABS
1000.0000 mg | ORAL_TABLET | Freq: Once | ORAL | Status: AC
  Administered 2016-12-04: 1000 mg via ORAL

## 2016-12-04 MED ORDER — CEFTRIAXONE SODIUM 250 MG IJ SOLR
250.0000 mg | Freq: Once | INTRAMUSCULAR | Status: AC
  Administered 2016-12-04: 250 mg via INTRAMUSCULAR

## 2016-12-04 MED ORDER — AZITHROMYCIN 250 MG PO TABS
ORAL_TABLET | ORAL | Status: AC
  Filled 2016-12-04: qty 4

## 2016-12-04 MED ORDER — LIDOCAINE HCL (PF) 1 % IJ SOLN
INTRAMUSCULAR | Status: AC
  Filled 2016-12-04: qty 2

## 2016-12-04 NOTE — ED Triage Notes (Signed)
PT reports exposure to STD two weeks ago with no symptoms.

## 2016-12-04 NOTE — Discharge Instructions (Signed)
You have been screened for infection diseases such as Gonorrhea, Chlamydia, You will be notified of the results of these tests in 24-72 hours. If any therapy needs to be started or if your current therapy needs to be changed, you will be notified and and given instructions as to what to do. If your symptoms persist or fail to resolve, follow up with your primary care provider or return to clinic.   Based on your symptoms and findings on physical exam, you are being treated for an infection. You have received an injection of Rocephin, 250 mg and a tablet of Azithromycin, 1 gram. I would recommend following up with your primary care provider, the health department, or return to clinic in 1 week for re-screening to ensure clearance of the infection.

## 2016-12-04 NOTE — ED Provider Notes (Signed)
CSN: [id number]     Arrival date & time 12/04/16  1723 History   None    Chief Complaint  Patient presents with  . Exposure to STD   (Consider location/radiation/quality/duration/timing/severity/associated sxs/prior Treatment) 36 year old male presents to clinic for evaluation for possible STD. He reports his partner called him and informed him that she was positive for chlamydia   The history is provided by the patient.  Exposure to STD  This is a new problem. The current episode started more than 1 week ago. The problem occurs rarely. Progression since onset: asymptomatic. Pertinent negatives include no chest pain, no abdominal pain, no headaches and no shortness of breath. Nothing aggravates the symptoms. Nothing relieves the symptoms. He has tried nothing for the symptoms.    History reviewed. No pertinent past medical history. Past Surgical History:  Procedure Laterality Date  . HERNIA REPAIR     left  . KNEE SURGERY Right 2011  . KNEE SURGERY Left 2010   No family history on file. Social History  Substance Use Topics  . Smoking status: Current Some Day Smoker    Types: Cigarettes  . Smokeless tobacco: Never Used  . Alcohol use Yes     Comment: occasionally    Review of Systems  Respiratory: Negative for shortness of breath.   Cardiovascular: Negative for chest pain.  Gastrointestinal: Negative for abdominal pain.  Genitourinary: Negative for discharge, dysuria, flank pain, penile pain, penile swelling and testicular pain.  Neurological: Negative for headaches.  All other systems reviewed and are negative.   Allergies  Skelaxin [metaxalone]  Home Medications   Prior to Admission medications   Medication Sig Start Date End Date Taking? Authorizing Provider  pantoprazole (PROTONIX) 40 MG tablet Take 40 mg by mouth daily.    Historical Provider, MD   Meds Ordered and Administered this Visit   Medications  cefTRIAXone (ROCEPHIN) injection 250 mg (250 mg  Intramuscular Given 12/04/16 1814)  azithromycin (ZITHROMAX) tablet 1,000 mg (1,000 mg Oral Given 12/04/16 1813)    BP (!) 163/104 (BP Location: Right Arm)   Pulse 62   Temp 99 F (37.2 C) (Oral)   Resp 16   Ht 6\' 1"  (1.854 m)   Wt 186 lb (84.4 kg)   SpO2 100%   BMI 24.54 kg/m  No data found.   Physical Exam  Constitutional: He is oriented to person, place, and time. He appears well-developed and well-nourished. No distress.  Abdominal: Soft. Bowel sounds are normal. He exhibits no distension. There is no tenderness. There is no guarding.  Genitourinary:  Genitourinary Comments: Deferred, urine cytology collected  Neurological: He is alert and oriented to person, place, and time.  Skin: Skin is warm and dry. Capillary refill takes less than 2 seconds. He is not diaphoretic.  Psychiatric: He has a normal mood and affect. His behavior is normal.  Nursing note and vitals reviewed.   Urgent Care Course     Procedures (including critical care time)  Labs Review Labs Reviewed  URINE CYTOLOGY ANCILLARY ONLY    Imaging Review No results found.     MDM   1. STD exposure    Urine cytology collected, ceftriaxone, and azithromycin administered, notify the patient in 3-5 business days. Provided counseling on safe sex practices, encouraged follow-up with Public health, primary care, or return to clinic if he becomes symptomatic     Dorena BodoLawrence Carnella Fryman, NP 12/04/16 1824

## 2016-12-06 LAB — URINE CYTOLOGY ANCILLARY ONLY
CHLAMYDIA, DNA PROBE: NEGATIVE
NEISSERIA GONORRHEA: NEGATIVE
Trichomonas: NEGATIVE

## 2017-09-16 ENCOUNTER — Other Ambulatory Visit: Payer: Self-pay | Admitting: Otolaryngology

## 2017-09-16 DIAGNOSIS — Q892 Congenital malformations of other endocrine glands: Secondary | ICD-10-CM

## 2017-09-19 ENCOUNTER — Other Ambulatory Visit: Payer: Self-pay | Admitting: Otolaryngology

## 2017-09-19 ENCOUNTER — Ambulatory Visit
Admission: RE | Admit: 2017-09-19 | Discharge: 2017-09-19 | Disposition: A | Discharge status: Home / Self Care | Payer: Commercial Managed Care - PPO | Source: Ambulatory Visit | Attending: Otolaryngology | Admitting: Otolaryngology

## 2017-09-19 DIAGNOSIS — Q892 Congenital malformations of other endocrine glands: Secondary | ICD-10-CM

## 2017-10-06 ENCOUNTER — Other Ambulatory Visit: Payer: Self-pay | Admitting: Otolaryngology

## 2017-10-06 DIAGNOSIS — R221 Localized swelling, mass and lump, neck: Secondary | ICD-10-CM

## 2017-10-07 ENCOUNTER — Ambulatory Visit
Admission: RE | Admit: 2017-10-07 | Discharge: 2017-10-07 | Disposition: A | Discharge status: Home / Self Care | Payer: Commercial Managed Care - PPO | Source: Ambulatory Visit | Attending: Otolaryngology | Admitting: Otolaryngology

## 2017-10-07 DIAGNOSIS — R221 Localized swelling, mass and lump, neck: Secondary | ICD-10-CM

## 2018-10-31 IMAGING — US US SOFT TISSUE HEAD/NECK
1 series · 11 of 11 positions shown · non-contrast
Comparison: None.

CLINICAL DATA: Thyroglossal duct cyst. Palpable nodule since
childhood with recent swelling which has since resolved.

EXAM:
ULTRASOUND OF HEAD/NECK SOFT TISSUES
TECHNIQUE: Ultrasound examination of the head and neck soft tissues was
performed in the area of clinical concern.

[Series 1: us soft tissue head/neck · 0.05mm/px · 11 of 11 slices shown]
[im 1/11]
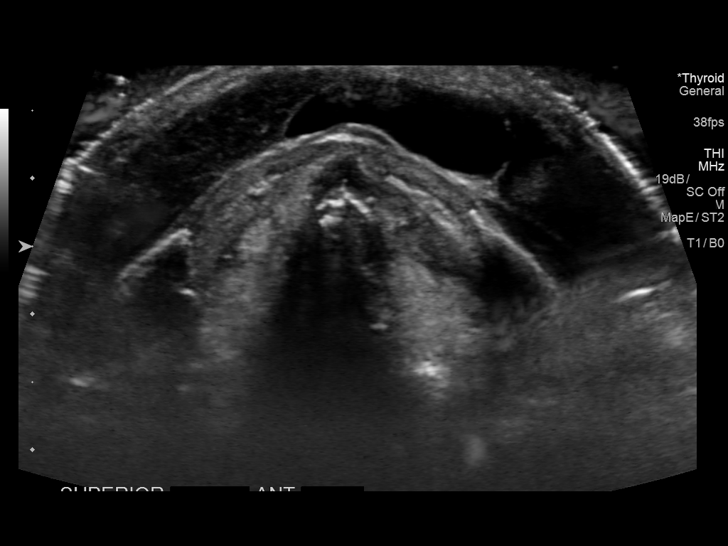
[im 2/11]
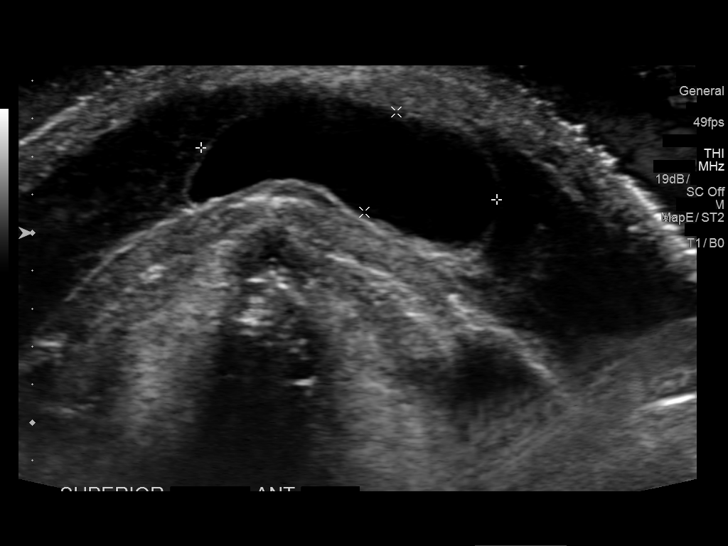
[im 3/11]
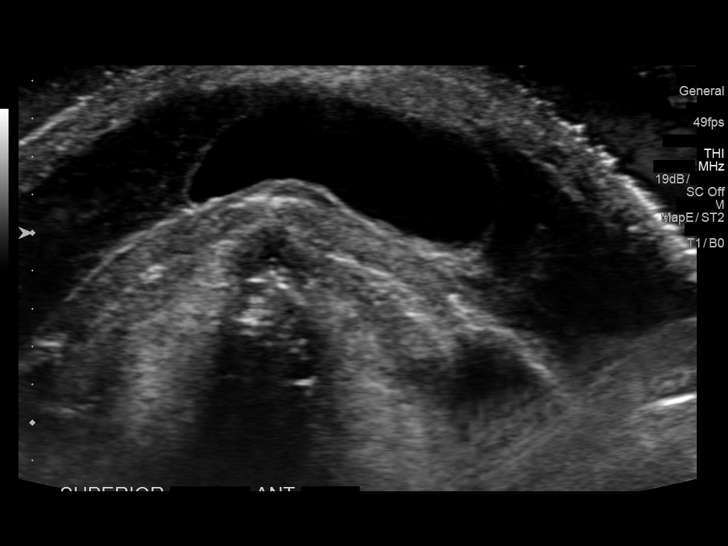
[im 4/11]
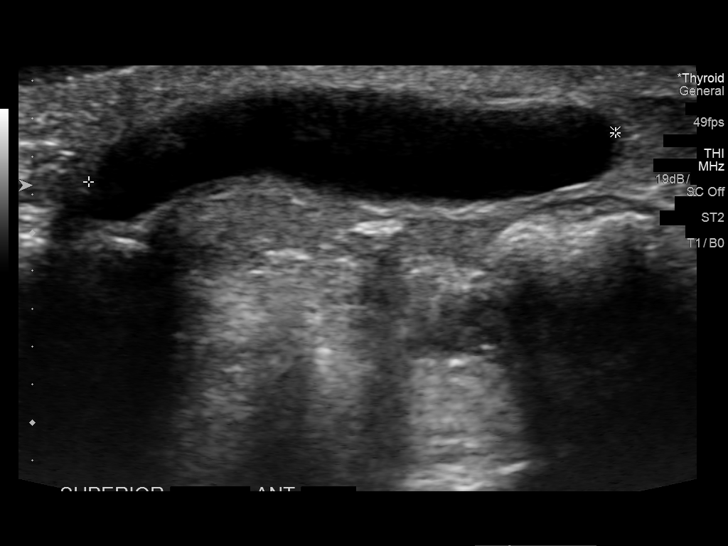
[im 5/11]
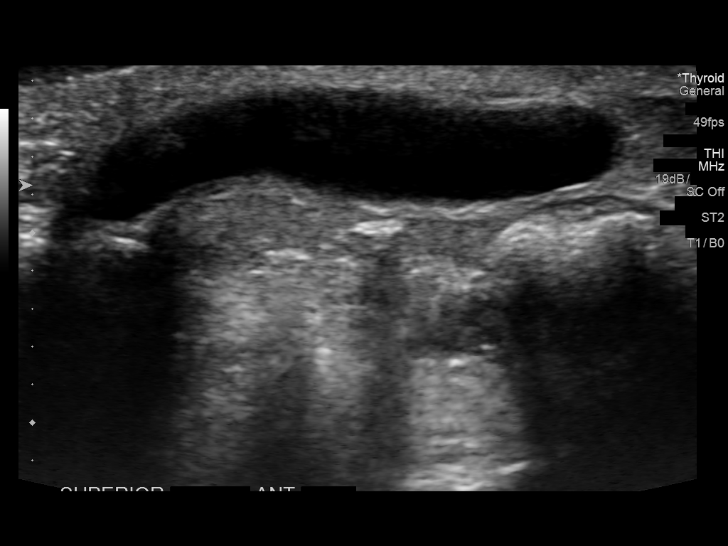
[im 6/11]
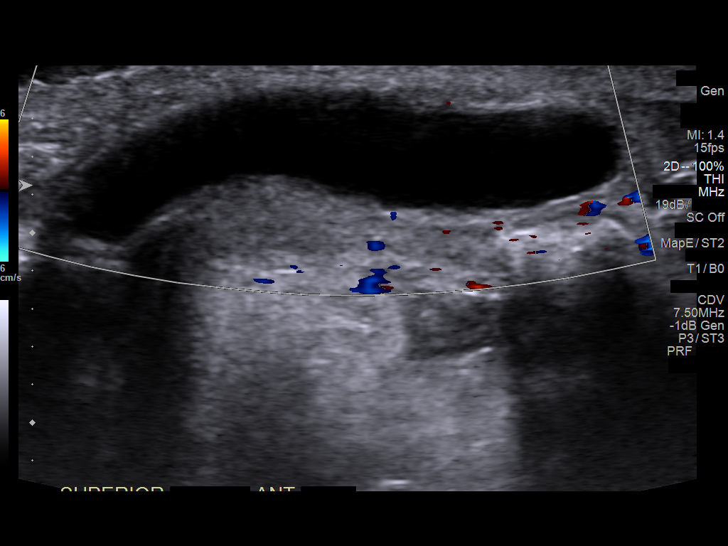
[im 7/11]
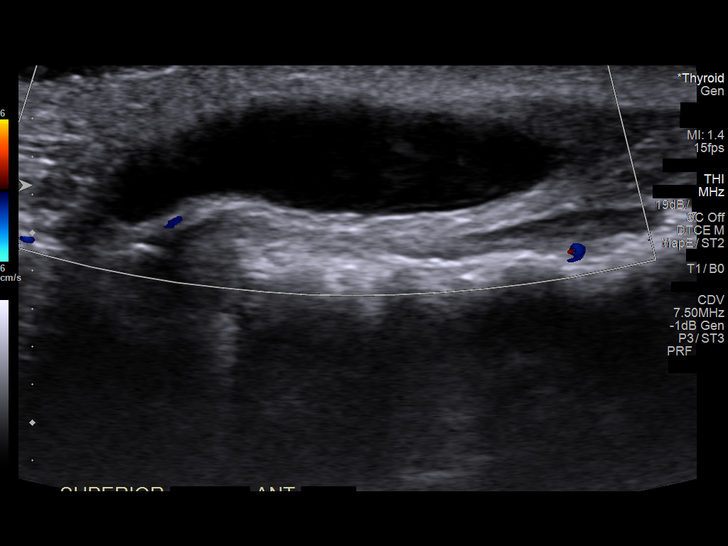
[im 8/11]
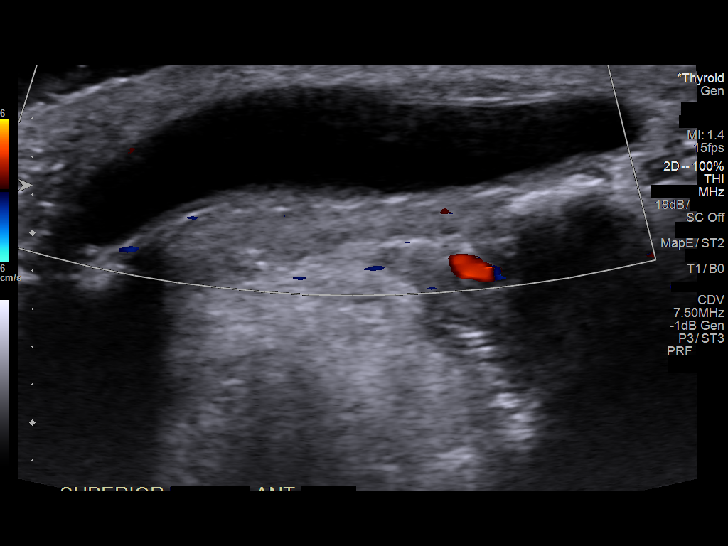
[im 9/11]
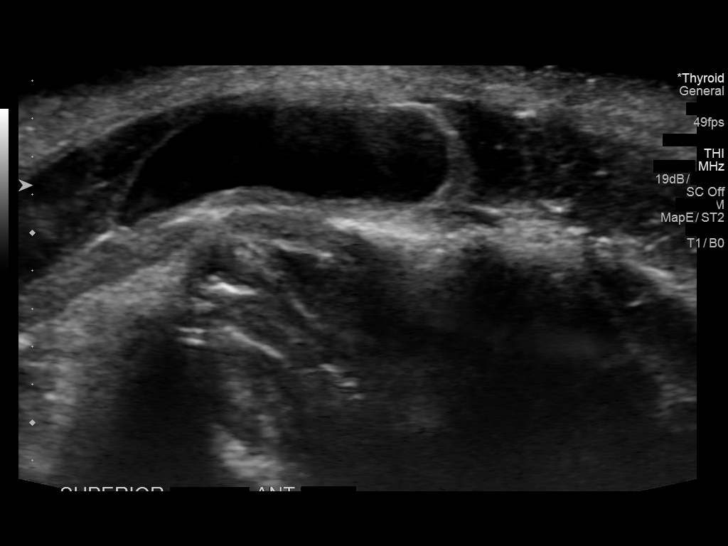
[im 10/11]
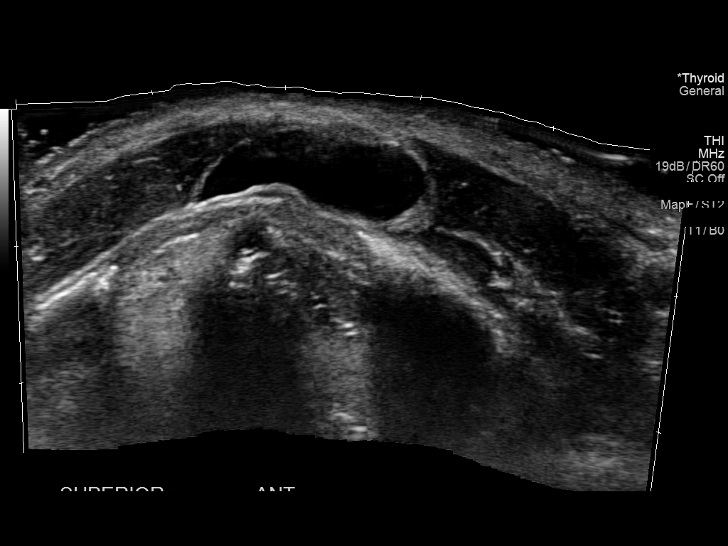
[im 11/11]
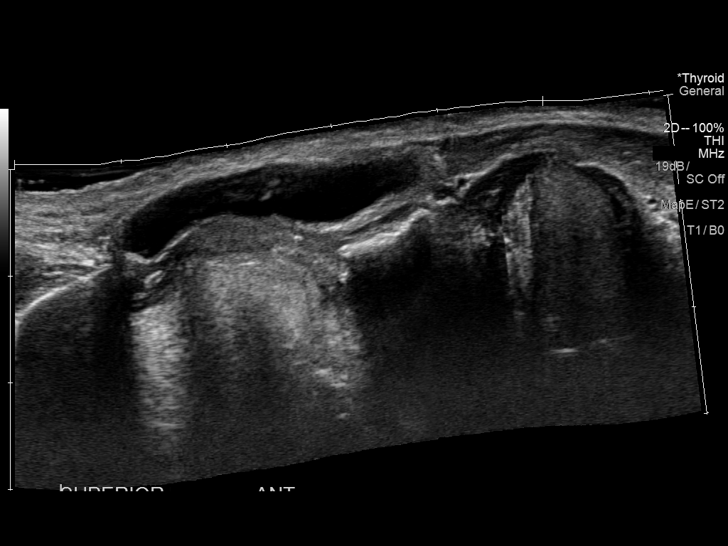

[11 of 11 positions shown; findings below may reference images not displayed]

FINDINGS: A well-defined cystic lesion is centered to the left of midline at
the level of the hyoid. The cyst measures 1.6 x 0.6 x 2.8 cm. The
lesion is just deep to the subcutaneous fat. No significant solid
tissue component is present.
IMPRESSION: Superficial cystic lesion centered to the left of midline adjacent
to the hyoid bone is compatible with a thyroglossal duct cyst
measuring 1.6 x 0.6 x 2.8 cm. No solid component is evident.

## 2023-03-04 ENCOUNTER — Other Ambulatory Visit (HOSPITAL_COMMUNITY): Payer: Self-pay | Admitting: General Surgery

## 2023-03-04 DIAGNOSIS — K439 Ventral hernia without obstruction or gangrene: Secondary | ICD-10-CM

## 2023-03-11 ENCOUNTER — Encounter (HOSPITAL_COMMUNITY): Payer: Self-pay

## 2023-03-11 ENCOUNTER — Ambulatory Visit (HOSPITAL_COMMUNITY): Admission: RE | Admit: 2023-03-11 | Payer: BC Managed Care – PPO | Source: Ambulatory Visit

## 2024-03-30 ENCOUNTER — Other Ambulatory Visit (HOSPITAL_BASED_OUTPATIENT_CLINIC_OR_DEPARTMENT_OTHER): Payer: Self-pay | Admitting: Internal Medicine

## 2024-03-30 DIAGNOSIS — E010 Iodine-deficiency related diffuse (endemic) goiter: Secondary | ICD-10-CM

## 2024-05-14 ENCOUNTER — Telehealth (HOSPITAL_BASED_OUTPATIENT_CLINIC_OR_DEPARTMENT_OTHER): Payer: Self-pay
